# Patient Record
Sex: Female | Born: 1975 | Hispanic: Yes | Marital: Married | Smoking: Never smoker
Health system: Western US, Academic
[De-identification: ages and names within clinical notes are randomized; demographics above are authoritative.]

## PROBLEM LIST (undated history)

## (undated) HISTORY — PX: ABDOMINOPLASTY: SHX000001

## (undated) HISTORY — PX: PR HYSTEROSCOPY,W/ENDOMETRIAL ABLATION: 58563

---

## 2006-09-04 HISTORY — PX: RC TUBAL LIGATION: 998000004

## 2008-09-04 HISTORY — PX: AUGMENTATION, BREAST: SHX000076

## 2016-10-05 ENCOUNTER — Encounter (HOSPITAL_BASED_OUTPATIENT_CLINIC_OR_DEPARTMENT_OTHER): Payer: Self-pay | Admitting: Obstetrics & Gynecology

## 2016-10-05 ENCOUNTER — Ambulatory Visit (HOSPITAL_BASED_OUTPATIENT_CLINIC_OR_DEPARTMENT_OTHER): Payer: Commercial Managed Care - PPO | Admitting: Obstetrics & Gynecology

## 2016-10-05 VITALS — BP 112/64 | Ht 63.5 in | Wt 162.0 lb

## 2016-10-05 DIAGNOSIS — Z01419 Encounter for gynecological examination (general) (routine) without abnormal findings: Principal | ICD-10-CM

## 2016-10-05 DIAGNOSIS — K5909 Other constipation: Secondary | ICD-10-CM

## 2016-10-05 DIAGNOSIS — Z1231 Encounter for screening mammogram for malignant neoplasm of breast: Secondary | ICD-10-CM

## 2016-10-05 DIAGNOSIS — R102 Pelvic and perineal pain: Secondary | ICD-10-CM

## 2016-10-05 MED ORDER — DOCUSATE SODIUM 100 MG CAPSULE
100.00 mg | ORAL_CAPSULE | Freq: Two times a day (BID) | ORAL | 3 refills | Status: AC
Start: 2016-10-05 — End: 2017-09-30

## 2016-10-05 NOTE — Progress Notes (Signed)
Patient had endometrial ablation 2016.  Bleeding currently is 3 days.  Since December patient has noticed dull pain in back associated with bloating thru month.  Sharp pain first day of menses.  continues with monthly menses    Patient's last menstrual period was 09/22/2016 (exact date).  Normal regular menses.  Patient has always struggled with constipation.  Agreeable to colace.        Gynecology Clinic Note - Well Woman Exam    Date: 10/05/2016      ID:  Shelby Johnston is a 44yr Unknown here for a well-woman exam.      CC:  Routine well woman exam    HPI:  Last pap:  2016  History of abn pap: none  Last new sexaul partener:  married  Last std check:  na  Desires std check: no    Sexual History:  Sexually Active yes.   Current sexual problems: none.  Pain with intercourse: no.         Menses: regular  Birth control:  btl    ROS:   Review of Systems    Meds:    Current Outpatient Prescriptions:     Docusate (COLACE) 100 mg Capsule, Take 1 capsule by mouth 2 times daily., Disp: 120 capsule, Rfl: 3    All:  No Known Allergies    Medical:  No past medical history on file.    Surg:    Past Surgical History:   Procedure Laterality Date    AUGMENTATION, BREAST  2010    CESAREAN SECTION  1999    CESAREAN SECTION  2003    CESAREAN SECTION  2008    PR HYSTEROSCOPY,W/ENDOMETRIAL ABLATION      RC TUBAL LIGATION  2008         OB History:  Obstetric History    G3   P3   T<na>   P<na>   A<na>   L3     SAB<na>   TAB<na>   Ectopic<na>   Multiple<na>   Live Births<na>    Obstetric Comments   Last pap: 04/08/2015 Negative, HPV negative    Last mammo: n/a   STD History: denies   Abnormal pap: denies       Social:    Social History     Social History    Marital status: MARRIED     Spouse name: N/A    Number of children: N/A    Years of education: N/A     Occupational History    Not on file.     Social History Main Topics    Smoking status: Never Smoker    Smokeless tobacco: Never Used    Alcohol use No     Drug use: No    Sexual activity: Yes     Partners: Male     Birth control/ protection: Surgical     Other Topics Concern    Not on file     Social History Narrative    married       Vitals:      Vitals:    10/05/16 0946   BP: 112/64   Weight: 162 lb (73.5 kg)   Height: 5' 3.5" (1.613 m)       Physical Exam:  Physical Exam   Constitutional: She appears well-developed and well-nourished.   Abdominal: Soft. She exhibits no distension and no mass. There is no tenderness. There is no rebound and no guarding.   Genitourinary: No breast swelling, tenderness, discharge  or bleeding. No labial fusion. There is no rash, tenderness, lesion or injury on the right labia. There is no rash, tenderness, lesion or injury on the left labia. Uterus is not tender. Cervix exhibits motion tenderness. Cervix exhibits no discharge and no friability. Right adnexum displays no mass, no tenderness and no fullness. Left adnexum displays no mass, no tenderness and no fullness. No erythema, tenderness or bleeding in the vagina. No foreign body in the vagina. No signs of injury around the vagina. No vaginal discharge found.   Genitourinary Comments: Breast: bilateral implants  Uterus:  Retroverted. Unable to feel fundus.  No adnexal masses palpable.   Skin: Skin is warm and dry.   Psychiatric: She has a normal mood and affect. Her behavior is normal. Judgment and thought content normal.         Results Reviewed:      Assessment and Plan:  Shelby Johnston is a Unknown  [id number] presenting for well woman exam    1. Pelvic pain in female    - US Pelvis, Transvag + US Pelvis, Transabd, Non-Ob, Complete; Future    2. Other constipation    - Docusate (COLACE) 100 mg Capsule; Take 1 capsule by mouth 2 times daily.  Dispense: 120 capsule; Refill: 3    3. Well woman exam with routine gynecological exam  Mikal Plane, MD

## 2016-10-05 NOTE — Progress Notes (Signed)
GENERAL.CONSTITUTIONAL  Chills, Fatiuge, Fever, loss of appetite, denies all    HEENT/NECK  Change in vision, ear pain, nasal congestion, sore throat, denies all    ENDOCRINE  Excessive thirst, excessive urination, weight gain, weight loss, denies all    RESPIRATORY  Cough, SOB, wheezing, denies all    CARDIOVASCULAR  Chest pain, dyspnea on exertion leg edema, palpitations, denies all    GASTROINTESTINAL  Abdominal pain, blood in stool, change in bowel habits, diarrhea, nausea,vomiting, denies all    HEMATOLOGY/LYMPH  Easy bleeding, easy bruising, swollen glands, denies all    GENITOURINARY  Blood in urine, pain with urination, urniary frequency, denies all     MUSCULOSKELETAL  Muscle pain, joint pain, joint swelling, denies all    DERMATOLOGIC  Itching, new/changing skin lesion, rash, denies all    NEUROLOGIC  Dizziness, fainting, headache, denies all    PSYCHIATRIC  Anxiety, depression, sleep disturbances, denies all   Note: This is a self-reported review of symptoms

## 2016-10-17 ENCOUNTER — Ambulatory Visit
Admission: RE | Admit: 2016-10-17 | Discharge: 2016-10-22 | Disposition: A | Discharge status: Home / Self Care | Payer: 59 | Source: Ambulatory Visit | Attending: Obstetrics & Gynecology | Admitting: Obstetrics & Gynecology

## 2016-10-17 DIAGNOSIS — R102 Pelvic and perineal pain: Principal | ICD-10-CM

## 2016-10-17 DIAGNOSIS — D259 Leiomyoma of uterus, unspecified: Secondary | ICD-10-CM

## 2017-10-12 ENCOUNTER — Encounter (HOSPITAL_BASED_OUTPATIENT_CLINIC_OR_DEPARTMENT_OTHER): Payer: Self-pay | Admitting: Obstetrics & Gynecology

## 2017-10-12 ENCOUNTER — Ambulatory Visit (HOSPITAL_BASED_OUTPATIENT_CLINIC_OR_DEPARTMENT_OTHER): Payer: Self-pay | Admitting: Obstetrics & Gynecology

## 2017-10-15 ENCOUNTER — Ambulatory Visit (HOSPITAL_BASED_OUTPATIENT_CLINIC_OR_DEPARTMENT_OTHER): Payer: 59 | Admitting: Obstetrics & Gynecology

## 2017-10-15 ENCOUNTER — Encounter (HOSPITAL_BASED_OUTPATIENT_CLINIC_OR_DEPARTMENT_OTHER): Payer: Self-pay | Admitting: Obstetrics & Gynecology

## 2017-10-15 VITALS — BP 138/82 | Ht 64.0 in | Wt 167.0 lb

## 2017-10-15 DIAGNOSIS — N83209 Unspecified ovarian cyst, unspecified side: Secondary | ICD-10-CM

## 2017-10-15 DIAGNOSIS — Z01419 Encounter for gynecological examination (general) (routine) without abnormal findings: Principal | ICD-10-CM

## 2017-10-15 NOTE — Progress Notes (Signed)
patient is here for well woman.    Patient has history of small cyst on ovary.  Patient is having lower back pain. Dull.  Intermittent.    Menses:  Regular  Birth control:  btl    Mammo:  2019 normal.  Dense breasts.  Declines sono    Pap:  2019 current.    No current outpatient medications on file.    No Known Allergies    No past medical history on file.    Past Surgical History:   Procedure Laterality Date    AUGMENTATION, BREAST  2010    CESAREAN SECTION  1999    CESAREAN SECTION  2003    CESAREAN SECTION  2008    PR HYSTEROSCOPY,W/ENDOMETRIAL ABLATION      RC TUBAL LIGATION  2008       OB History   Gravida Para Term Preterm AB Living   3 3 <na> <na> <na> 3   SAB TAB Ectopic Multiple Live Births   <na> <na> <na> <na> <na>   Obstetric Comments   Last pap: 04/08/2015 Negative, HPV negative    Periods: every 28 days, regular, no problems.     Sexual activity with men, currently sexually active, married .      Last mammogram date 2019, before augmentation.     Abnormal pap smear none.     Date of Last Period 09/24/2017   Sexually Transmitted Diseases (STDs) none.  /   Birth control bilateral tubal ligation.     Menarche: Age at onset of menarche 28.        Social History     Socioeconomic History    Marital status: MARRIED     Spouse name: Not on file    Number of children: Not on file    Years of education: Not on file    Highest education level: Not on file   Occupational History    Not on file   Social Needs    Financial resource strain: Not on file    Food insecurity:     Worry: Not on file     Inability: Not on file    Transportation needs:     Medical: Not on file     Non-medical: Not on file   Tobacco Use    Smoking status: Never Smoker    Smokeless tobacco: Never Used   Substance and Sexual Activity    Alcohol use: No    Drug use: No    Sexual activity: Yes     Partners: Male     Birth control/protection: Surgical   Lifestyle    Physical activity:     Days per week: Not on file      Minutes per session: Not on file    Stress: Not on file   Relationships    Social connections:     Talks on phone: Not on file     Gets together: Not on file     Attends religious service: Not on file     Active member of club or organization: Not on file     Attends meetings of clubs or organizations: Not on file     Relationship status: Not on file    Intimate partner violence:     Fear of current or ex partner: Not on file     Emotionally abused: Not on file     Physically abused: Not on file     Forced sexual activity: Not on file  Other Topics Concern    Not on file   Social History Narrative    married       BP 138/82 (SITE: left arm, Orthostatic Position: sitting, Cuff Size: regular)   Ht 5\' 4"  (1.626 m)   Wt 167 lb (75.8 kg)   LMP 09/24/2017   BMI 28.67 kg/m   BP 138/82 (SITE: left arm, Orthostatic Position: sitting, Cuff Size: regular)   Ht 5\' 4"  (1.626 m)   Wt 167 lb (75.8 kg)   LMP 09/24/2017   BMI 28.67 kg/m   Physical Exam    General Appearance: WDWN, NAD  Psychiatric: Normal orientation, mood and affect  Breasts: Symmetrical, no masses and no discharge  Skin:No rashes, lesions, or ulcers  Lymph: Normal axillary  Abd:  NTND, no masses, non hernia, no HSM, no rebound/guarding  Pelvic examination:              Vulva/Perineum:  Normal development, no lesions, normal hair distribution, normal anus             Urethral Meatus:Normal location and size, no lesions or prolapse             Urethra: No masses, tenderness or scarring             Bladder:No masses, no tenderness             Vagina: Normal appearance, est effect, no discharge, lesions, cystocele or rectocele             Cervix: Normal appearance, no lesions, discharge or tenderness             Uterus: Normal size, contour, position, mobile, no tenderness and descensus             Adnexae:No masses, tenderness or nodularity  Ext:  No leg edema bilaterally, no calf tenderness bilaterally    Shelby Johnston was seen today for well woman  (gyn).    Diagnoses and all orders for this visit:    Well woman exam with routine gynecological exam    Cyst of ovary, unspecified laterality    Other orders  -     [facility] US PELVIS COMPLETE; Future

## 2017-10-15 NOTE — Nursing Note (Addendum)
GENERAL.CONSTITUTIONAL: Chills, Fatigue, Fever, loss of appetite    HEENT/NECK: Change in vision, ear pain, nasal congestion, sore throat    ENDOCRINE: Excessive thirst, excessive urination, weight gain, weight loss    RESPIRATORY: Cough, SOB, wheezing    CARDIOVASCULAR: Chest pain, dyspnea on exertion leg edema, palpitations    GASTROINTESTINAL: Abdominal pain, blood in stool, change in bowel habits, diarrhea, nausea,vomiting    HEMATOLOGY/LYMPH: Easy bleeding, easy bruising, swollen glands     GENITOURINARY: Blood in urine, pain with urination, urinary frequency    MUSCULOSKELETAL: Muscle pain, joint pain, joint swelling    DERMATOLOGIC: Itching, new/changing skin lesion, rash     NEUROLOGIC: Dizziness, fainting, headache    PSYCHIATRIC: Anxiety, depression, sleep disturbances    Note: This is a self-reported review of symptoms

## 2017-11-14 ENCOUNTER — Ambulatory Visit
Admission: RE | Admit: 2017-11-14 | Discharge: 2017-11-14 | Disposition: A | Discharge status: Home / Self Care | Payer: 59 | Source: Ambulatory Visit | Attending: Obstetrics & Gynecology | Admitting: Obstetrics & Gynecology

## 2017-11-14 DIAGNOSIS — N83209 Unspecified ovarian cyst, unspecified side: Secondary | ICD-10-CM

## 2018-07-15 ENCOUNTER — Encounter (HOSPITAL_BASED_OUTPATIENT_CLINIC_OR_DEPARTMENT_OTHER): Payer: Self-pay | Admitting: Obstetrics & Gynecology

## 2018-07-15 ENCOUNTER — Ambulatory Visit (HOSPITAL_BASED_OUTPATIENT_CLINIC_OR_DEPARTMENT_OTHER): Payer: 59 | Admitting: Obstetrics & Gynecology

## 2018-07-15 VITALS — BP 126/82 | Ht 64.0 in | Wt 162.8 lb

## 2018-07-15 DIAGNOSIS — M545 Low back pain: Secondary | ICD-10-CM

## 2018-07-15 DIAGNOSIS — R6882 Decreased libido: Principal | ICD-10-CM

## 2018-07-15 MED ORDER — BREMELANOTIDE 1.75 MG/0.3 ML SUBCUTANEOUS AUTO-INJECTOR
AUTO-INJECTOR | SUBCUTANEOUS | 11 refills | Status: DC
Start: 2018-07-15 — End: 2018-11-04

## 2018-07-15 NOTE — Progress Notes (Signed)
patient is here for dull lower back pain.  Happening midpoint between menses.      Birth control: btl    This pain has been happening for a couple of years.  Patient has low sex drive.     Working.  Same job.  Marriage is good.  Children:  20, 16, 11.    Intercourse:  Not pain.  Just not enjoyed.  Patient is not aroused.  Having sex about:  1 time this year.  Husband is mildly concerned.      10 year ago:  3-5 times a week.    Still having regular menses.  Last 4 days.      Patient has no history of painful sex.  Previously enjoyed sexual activity.     No hypertension.    Patient has read about vyleesi and would like to try.  Order thru company since not covered by insurance.    Patient will try motrin for back pain.      Current Outpatient Medications:     Acetaminophen (TYLENOL) 325 mg Tablet,   , Disp: , Rfl:     bremelanotide (VYLEESI) 1.75 mg/0.3 mL Auto-Injector, Use one injection - 45 minutes before desired response.  May use 1 injection per 24 hours.  Not to exceed 8 doses in 1 month., Disp: 8 syringe, Rfl: 11    [employer] IBUPROFEN JR 100 mg Tablet, 200 mg., Disp: , Rfl:     No Known Allergies    No past medical history on file.    Past Surgical History:   Procedure Laterality Date    ABDOMINOPLASTY      AUGMENTATION, BREAST  2010    CESAREAN SECTION  1999    CESAREAN SECTION  2003    CESAREAN SECTION  2008    PR HYSTEROSCOPY,W/ENDOMETRIAL ABLATION      RC TUBAL LIGATION  2008       OB History   Gravida Para Term Preterm AB Living   3 3 <na> <na> <na> 3   SAB TAB Ectopic Multiple Live Births   <na> <na> <na> <na> <na>   Obstetric Comments   Last pap: 04/08/2015 Negative, HPV negative    Periods: every 28 days, regular, no problems.     Sexual activity with men, currently sexually active, married .      Last mammogram date 2019, before augmentation.     Abnormal pap smear none.     Date of Last Period 07/12/2018   Sexually Transmitted Diseases (STDs) none.  /   Birth control bilateral tubal ligation.      Menarche: Age at onset of menarche 56.        BP 126/82 (SITE: left arm, Orthostatic Position: sitting, Cuff Size: regular)   Ht 5\' 4"  (1.626 m)   Wt 162 lb 12.8 oz (73.8 kg)   LMP 07/12/2018 (Exact Date)   BMI 27.94 kg/m     BP 126/82 (SITE: left arm, Orthostatic Position: sitting, Cuff Size: regular)   Ht 5\' 4"  (1.626 m)   Wt 162 lb 12.8 oz (73.8 kg)   LMP 07/12/2018 (Exact Date)   BMI 27.94 kg/m   Physical Exam    General Appearance: WDWN, NAD  Psychiatric: Normal orientation, mood and affect      Shelby Johnston was seen today for gyn exam.    Diagnoses and all orders for this visit:    Decreased libido without sexual dysfunction    Other orders  -     bremelanotide (VYLEESI)  1.75 mg/0.3 mL Auto-Injector; Use one injection - 45 minutes before desired response.  May use 1 injection per 24 hours.  Not to exceed 8 doses in 1 month.      Mikal Plane, MD

## 2018-07-15 NOTE — Nursing Note (Signed)
GENERAL.CONSTITUTIONAL: Chills, Fatigue, Fever, loss of appetite    HEENT/NECK: Change in vision, ear pain, nasal congestion, sore throat    ENDOCRINE: Excessive thirst, excessive urination, weight gain, weight loss    RESPIRATORY: Cough, SOB, wheezing    CARDIOVASCULAR: Chest pain, dyspnea on exertion leg edema, palpitations    GASTROINTESTINAL: Abdominal pain, blood in stool, change in bowel habits, diarrhea, nausea,vomiting    HEMATOLOGY/LYMPH: Easy bleeding, easy bruising, swollen glands     GENITOURINARY: Blood in urine, pain with urination, urinary frequency    MUSCULOSKELETAL: Muscle pain, joint pain, joint swelling    DERMATOLOGIC: Itching, new/changing skin lesion, rash     NEUROLOGIC: Dizziness, fainting, headache    PSYCHIATRIC: Anxiety, depression, sleep disturbances    Note: This is a self-reported review of symptoms

## 2018-11-04 ENCOUNTER — Ambulatory Visit (HOSPITAL_BASED_OUTPATIENT_CLINIC_OR_DEPARTMENT_OTHER): Payer: 59 | Admitting: Obstetrics & Gynecology

## 2018-11-04 ENCOUNTER — Encounter (HOSPITAL_BASED_OUTPATIENT_CLINIC_OR_DEPARTMENT_OTHER): Payer: Self-pay | Admitting: Obstetrics & Gynecology

## 2018-11-04 VITALS — BP 122/72 | HR 88 | Resp 14 | Ht 64.0 in | Wt 165.0 lb

## 2018-11-04 DIAGNOSIS — Z01419 Encounter for gynecological examination (general) (routine) without abnormal findings: Principal | ICD-10-CM

## 2018-11-04 DIAGNOSIS — Z9229 Personal history of other drug therapy: Secondary | ICD-10-CM

## 2018-11-04 NOTE — Progress Notes (Signed)
Mammo:  2020  Pap:  2016  Immunizations:  Flu, tetanus    Has pcp      Current Outpatient Medications:     Acetaminophen (TYLENOL) 325 mg Tablet,   , Disp: , Rfl:     CVS IBUPROFEN JR 100 mg Tablet, 200 mg., Disp: , Rfl:     No Known Allergies    No past medical history on file.    Past Surgical History:   Procedure Laterality Date    ABDOMINOPLASTY      AUGMENTATION, BREAST  2010    CESAREAN SECTION  1999    CESAREAN SECTION  2003    CESAREAN SECTION  2008    PR HYSTEROSCOPY,W/ENDOMETRIAL ABLATION      RC TUBAL LIGATION  2008       OB History   Gravida Para Term Preterm AB Living   3 3 <na> <na> <na> 3   SAB TAB Ectopic Multiple Live Births   <na> <na> <na> <na> <na>   Obstetric Comments   Last pap: 04/08/2015 Negative, HPV negative    Periods: every 28 days, regular, no problems.     Sexual activity with men, currently sexually active, married .      Last mammogram date 2020   Abnormal pap smear none.     Date of Last Period 10/29/18   Sexually Transmitted Diseases (STDs) none.     Birth control bilateral tubal ligation.     Menarche: Age at onset of menarche 40.        Social History     Socioeconomic History    Marital status: MARRIED     Spouse name: Not on file    Number of children: Not on file    Years of education: Not on file    Highest education level: Not on file   Occupational History    Not on file   Social Needs    Financial resource strain: Not on file    Food insecurity     Worry: Not on file     Inability: Not on file    Transportation needs     Medical: Not on file     Non-medical: Not on file   Tobacco Use    Smoking status: Never Smoker    Smokeless tobacco: Never Used   Substance and Sexual Activity    Alcohol use: No    Drug use: No    Sexual activity: Yes     Partners: Male     Birth control/protection: Surgical   Lifestyle    Physical activity     Days per week: Not on file     Minutes per session: Not on file    Stress: Not on file   Relationships    Social  connections     Talks on phone: Not on file     Gets together: Not on file     Attends religious service: Not on file     Active member of club or organization: Not on file     Attends meetings of clubs or organizations: Not on file     Relationship status: Not on file    Intimate partner violence     Fear of current or ex partner: Not on file     Emotionally abused: Not on file     Physically abused: Not on file     Forced sexual activity: Not on file   Other Topics Concern    Not on file  Social History Narrative    married       No family history on file.    BP 122/72 (SITE: right arm, Orthostatic Position: sitting, Cuff Size: regular)   Pulse 88   Resp 14   Ht 1.626 m (5\' 4" )   Wt 74.8 kg (165 lb)   LMP 10/29/2018 (Exact Date)   SpO2 98%   BMI 28.32 kg/m     BP 122/72 (SITE: right arm, Orthostatic Position: sitting, Cuff Size: regular)   Pulse 88   Resp 14   Ht 1.626 m (5\' 4" )   Wt 74.8 kg (165 lb)   LMP 10/29/2018 (Exact Date)   SpO2 98%   BMI 28.32 kg/m   Physical Exam    General Appearance: WDWN, NAD  Psychiatric: Normal orientation, mood and affect  Breasts: Symmetrical, no masses and no discharge, implants  Skin:No rashes, lesions, or ulcers  Lymph: Normal axillary  Abd:  NTND, no masses, non hernia, no HSM, no rebound/guarding  Pelvic examination:              Vulva/Perineum:  Normal development, no lesions, normal hair distribution, normal anus             Urethral Meatus:Normal location and size, no lesions or prolapse             Urethra: No masses, tenderness or scarring             Bladder:No masses, no tenderness             Vagina: Normal appearance, est effect, no discharge, lesions, cystocele or rectocele             Cervix: Normal appearance, no lesions, discharge or tenderness             Uterus: enlarged to 10 cm, contour, position, mobile, no tenderness and descensus             Adnexae:No masses, tenderness or nodularity  Ext:  No leg edema bilaterally, no calf tenderness  bilaterally    [place] was seen today for well woman (gyn).    Diagnoses and all orders for this visit:    Well woman exam with routine gynecological exam    Immunizations up to date      Mikal Plane, MD

## 2020-05-05 HISTORY — PX: PR REMOVAL INTACT BREAST IMPLANT: 19328

## 2021-02-02 LAB — COVID-19 (OUTSIDE ORGANIZATION): COVID-19 Results (Outside Organization): NOT DETECTED

## 2021-10-27 ENCOUNTER — Ambulatory Visit (HOSPITAL_BASED_OUTPATIENT_CLINIC_OR_DEPARTMENT_OTHER): Payer: 59 | Admitting: Family

## 2021-11-03 ENCOUNTER — Ambulatory Visit (HOSPITAL_BASED_OUTPATIENT_CLINIC_OR_DEPARTMENT_OTHER): Payer: 59 | Admitting: Family

## 2021-11-03 ENCOUNTER — Ambulatory Visit: Payer: 59

## 2021-11-03 ENCOUNTER — Encounter (HOSPITAL_BASED_OUTPATIENT_CLINIC_OR_DEPARTMENT_OTHER): Payer: Self-pay | Admitting: Family

## 2021-11-03 ENCOUNTER — Ambulatory Visit: Payer: 59 | Attending: Family | Admitting: Family

## 2021-11-03 VITALS — BP 124/76 | HR 60 | Temp 98.9°F | Resp 12 | Ht 64.0 in | Wt 170.0 lb

## 2021-11-03 DIAGNOSIS — Z1231 Encounter for screening mammogram for malignant neoplasm of breast: Secondary | ICD-10-CM

## 2021-11-03 DIAGNOSIS — Z1211 Encounter for screening for malignant neoplasm of colon: Secondary | ICD-10-CM

## 2021-11-03 DIAGNOSIS — Z113 Encounter for screening for infections with a predominantly sexual mode of transmission: Secondary | ICD-10-CM

## 2021-11-03 DIAGNOSIS — Z01419 Encounter for gynecological examination (general) (routine) without abnormal findings: Secondary | ICD-10-CM

## 2021-11-03 DIAGNOSIS — Z124 Encounter for screening for malignant neoplasm of cervix: Secondary | ICD-10-CM

## 2021-11-03 NOTE — Progress Notes (Signed)
Patient in for [place].  She is noticing she is having heavier cycles with increase pain after ablation.

## 2021-11-03 NOTE — Progress Notes (Signed)
HPI:  Shelby Johnston 46yr present to clinic today for well woman exam. LMP: 10/18/2021. Menses started at age 46, every 28 days, lasting 5 days.  Her periods are starting to becomne painful. She had an ablation 6 years ago. She takes Midol, which has helped her. Denies dysuria, abnormal uterine bleeding, abnormal vaginal discharge, vaginal pain and dyspareunia. Last PAP smear 2016. Denies h/o of STDs, fibroids or cysts. Sexually active with one female/female partner. She had BTL; no need for contraception.   Last mammogram normal, no h/o abnormal mammograms. Performs self breast exams monthly and denies breast masses, skin changes and nipple discharge. Patient exercises (HIT and yoga) and has healthy eating habits (for the past 3 months she has been on a low-carb, high-protein diet). She reports she has gained more than 10# in the past year. She feels safe at home and denies anxiety and depression. She has not complaints today and feels well.   Denis ETOH/tobacco/drugs  Works as a Warden/ranger  She has 3 kids: 57, 60 and 41    The patient was given the opportunity to ask questions throughout the visit.  These were answered to her stated satisfaction and she is agreeable to the plan of care.       No past medical history on file.  Past Surgical History:   Procedure Laterality Date    ABDOMINOPLASTY      AUGMENTATION, BREAST  2010    CESAREAN SECTION  1999    CESAREAN SECTION  2003    CESAREAN SECTION  2008    PR HYSTEROSCOPY,W/ENDOMETRIAL ABLATION      RC TUBAL LIGATION  2008     Current Outpatient Medications on File Prior to Visit   Medication Sig Dispense Refill    Acetaminophen (TYLENOL) 325 mg Tablet       CVS IBUPROFEN JR 100 mg Tablet 200 mg.       No current facility-administered medications on file prior to visit.     No Known Allergies      ROS  Constitutional: Negative.    HEENT: Negative  Respiratory: Negative.    Cardiovascular: Negative.    GI: Negative  Genitourinary: Negative  Skin: Negative.     M/S: Negative  Neurological: Negative.      BP 124/76 (SITE: left arm, Orthostatic Position: sitting, Cuff Size: regular)   Pulse 60   Temp 37.2 C (98.9 F) (Tympanic)   Resp 12   Ht 1.626 m (5\' 4" )   Wt 77.1 kg (170 lb)   BMI 29.18 kg/m?      P.E.  Gen- no acute distress, alert and oriented x 3  HEENT: NCAT, no thyromegaly, no LAD  Heart- RRR, no m/r/g  Lungs- Symmetric, CTAB  Breasts: No masses, skin changes, nipple discharge or tenderness noted  Abdomen- soft, NT, Non distended. No HSM, no masses, bowel sounds present  GU: externalia genitalia , urethra, perineum and anus with no lesions or masses.  Speculum exam revealed moist, pink, vaginal rugae, no masses or lesions and no significant bladder, uterine or rectal prolapse.  Cervix non-friable without any masses, lesions or irregularities.  No blood visualized at os.  No abonormal or malodorous discharge. PAP collected today.  Bimanual exam no masses appreciated, no adnexal masses or tenderness on palpation.   Ext: No cyanosis, no edema    A&P    ICD-10-CM    1. Well woman exam  Z01.419       2. Cervical cancer screening  Z12.4 Pap Smear w/ High Risk HPV [facility])     Pap Smear w/ High Risk HPV ([place])      3. Encounter for screening mammogram for malignant neoplasm of breast  Z12.31 [facility] MAMMOGRAM BREAST SCREENING W/TOMO      4. Colon cancer screening  Z12.11 COLOGUARD     COLOGUARD      5. Screen for STD (sexually transmitted disease)  Z11.3 [facility] Chlamydia/Gonorrhea DNA Probe     HIV Ag/Ab Combo Screen     Hepatitis C Ab Screen     Hepatitis B Surface Antigen     [facility] Syphilis Test Serum ([facility])     [employer] Chlamydia/Gonorrhea DNA Probe        - Cologuard future dated to patient's upcoming birthday  - Mammogram ordered.   - PAP collected today, will call with results.    - STD screening labs ordered.    - patient counseled on breast and ovarian awareness and symptoms which warrant further evaluation.  - Patient counseled to notify me if any abnormal vaginal bleeding  or spotting  - Patient counseled on health eating habits and exercise 54min/day, 5x/week to maintain health and decrease risk of breast cancer  - follow up in one year or sooner if needed    Progress notes authenticated by Ciro Backer, NP 11/03/2021 1:12 PM

## 2021-11-03 NOTE — Patient Instructions (Signed)
STD screening and PAP ordered, will call with results.   Mammogram ordered, will call with results.     Nice to meet you!

## 2021-11-04 LAB — HEPATITIS C AB SCREEN: Hepatitis C Ab Screen: NONREACTIVE

## 2021-11-04 LAB — MMC SYPHILIS TEST SERUM (RPR): RPR Screen: NONREACTIVE

## 2021-11-04 LAB — HIV AG/AB COMBO SCREEN: HIV Ag/Ab Combo Screen: NONREACTIVE

## 2021-11-04 LAB — HEPATITIS B SURFACE ANTIGEN: Hepatitis B Surface Antigen: NONREACTIVE

## 2021-11-04 LAB — MMC CHLAMYDIA/GONORRHEA DNA PROBE
Chlamydia Trachomatis Dna Probe: NEGATIVE
Neisseria Gonorrhea Dna Probe: NEGATIVE

## 2021-11-13 LAB — PAP SMEAR W/ HIGH RISK HPV (MMC)
.: 0
HPV Aptima_Ext: NEGATIVE

## 2021-12-11 LAB — COVID-19 (OUTSIDE ORGANIZATION): COVID-19 Results (Outside Organization): NOT DETECTED

## 2021-12-16 ENCOUNTER — Ambulatory Visit: Payer: 59

## 2022-01-06 ENCOUNTER — Ambulatory Visit
Admission: RE | Admit: 2022-01-06 | Discharge: 2022-01-06 | Disposition: A | Discharge status: Home / Self Care | Payer: 59 | Source: Ambulatory Visit | Attending: Family | Admitting: Family

## 2022-01-06 DIAGNOSIS — Z1231 Encounter for screening mammogram for malignant neoplasm of breast: Secondary | ICD-10-CM | POA: Insufficient documentation

## 2022-03-16 IMAGING — CT CT ABD/PEL WO CONT
2 of 4 series · 11 of 46 positions shown, 12 images · non-contrast
Comparison: CT abdomen and pelvis, 06/07/15

HISTORY: Mid abdominal pain with nausea, vomiting and urinary tract infection.
TECHNIQUE: Scans of abdomen and pelvis are performed at 5 mm increments following administration of oral contrast only with coronal and sagittal reconstructions.

Dose reduction technique used: Automated exposure control and adjustment of the mA and/or kV according to patient size. CT count in previous 12-months: 0
Total radiation dose to patient is CTDIvol 14.70 mGy and DLP 793.00 mGy-cm.

[Series 2: soft tissue · axial · 0.51mm/px · z∈[+1167,+1582]mm · 8 of 101 slices shown, 9 images]
[im 9/101  soft-tissue]
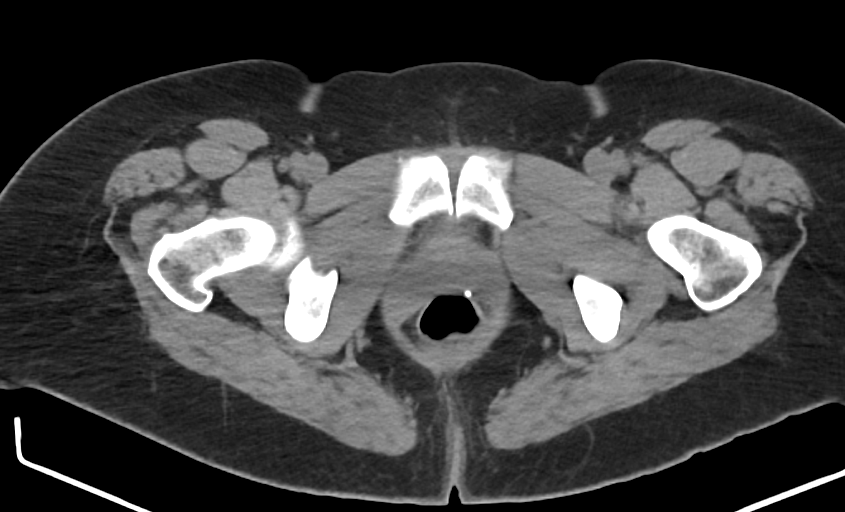
[im 9/101  bone]
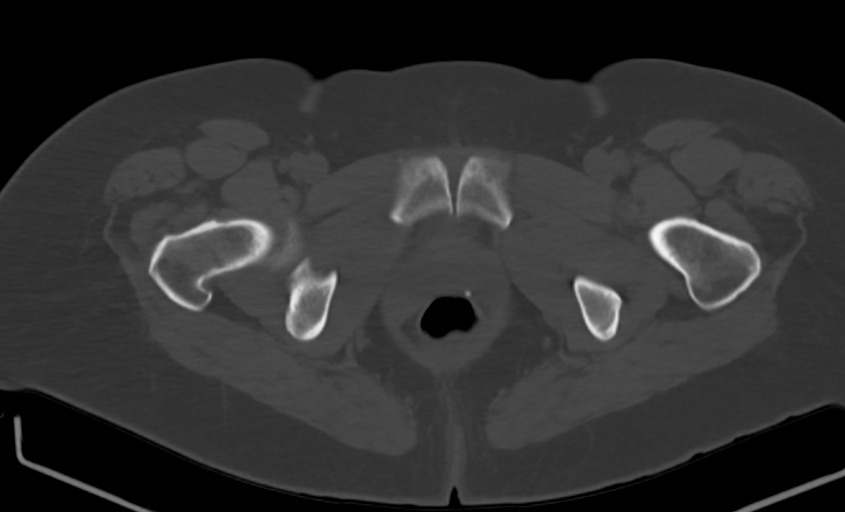
[im 22/101  soft-tissue]
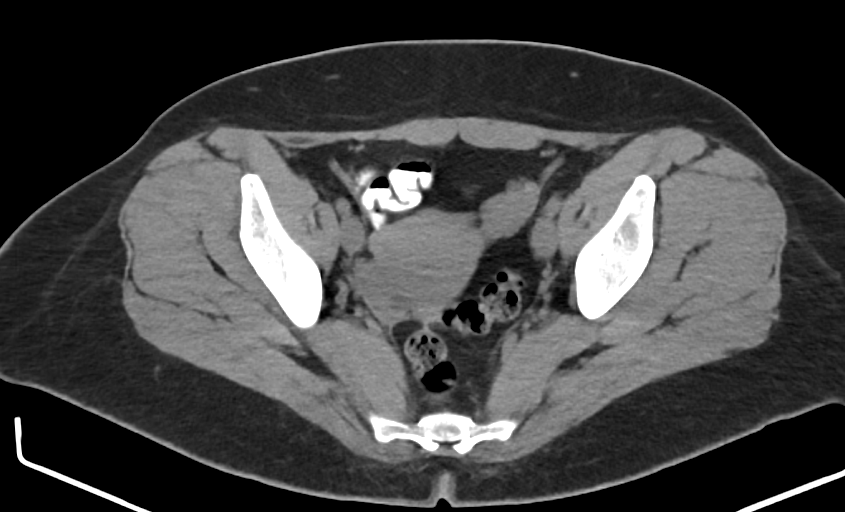
[im 31/101  soft-tissue]
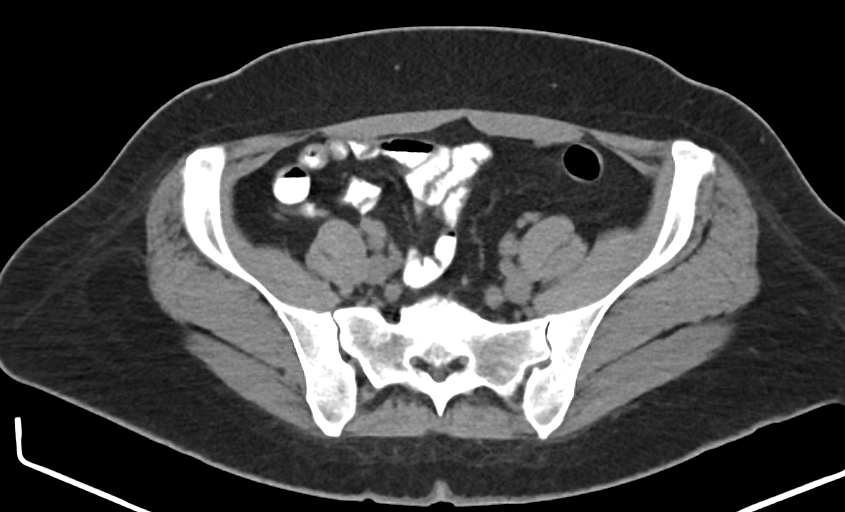
[im 44/101  soft-tissue]
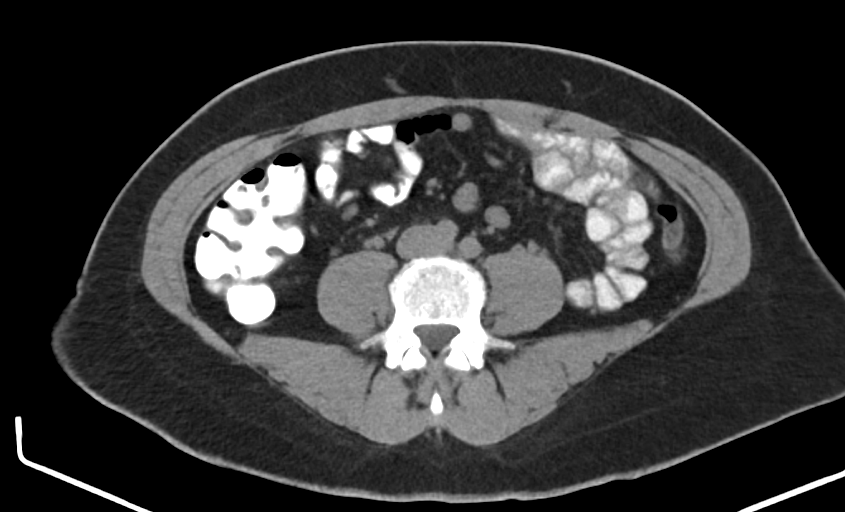
[im 57/101  soft-tissue]
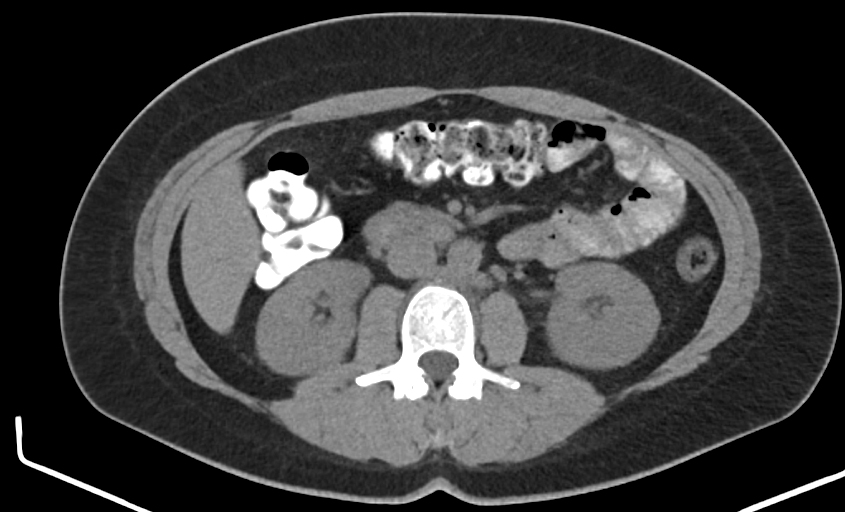
[im 70/101  soft-tissue]
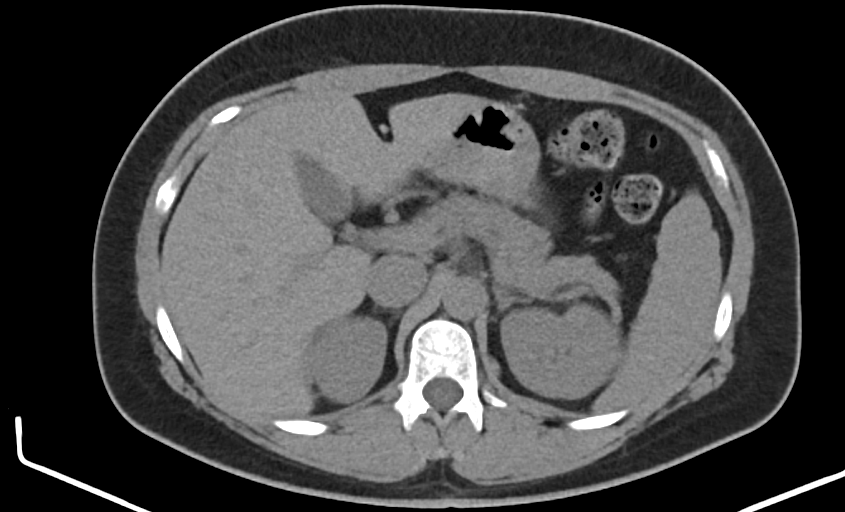
[im 79/101  soft-tissue]
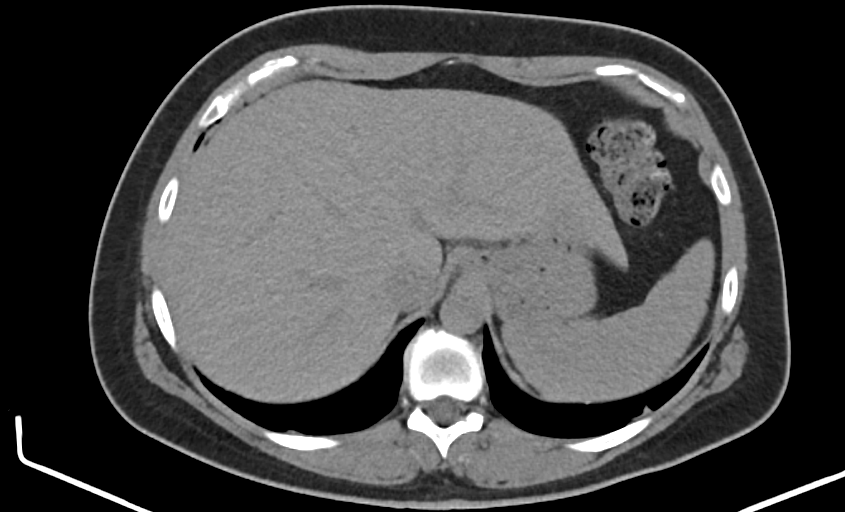
[im 92/101  soft-tissue]
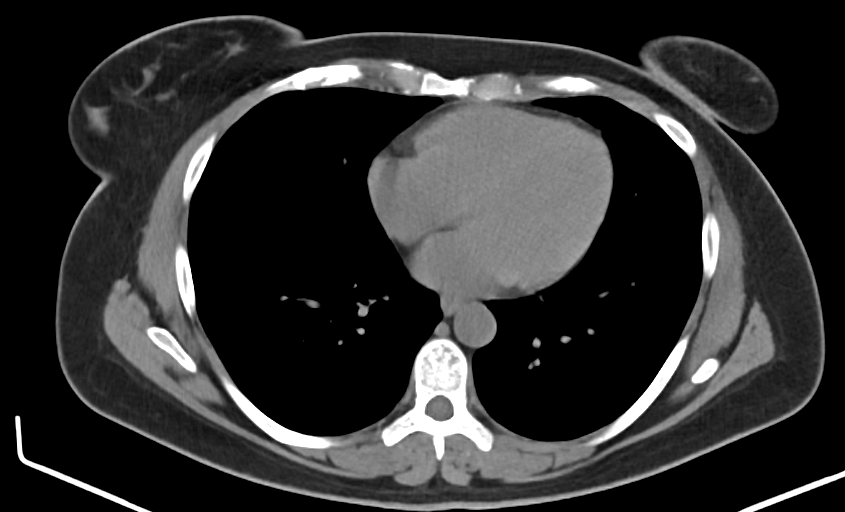

[Series 4: coronal · coronal · 0.85mm/px · 3 of 52 slices shown]
[im 18/52  soft-tissue]
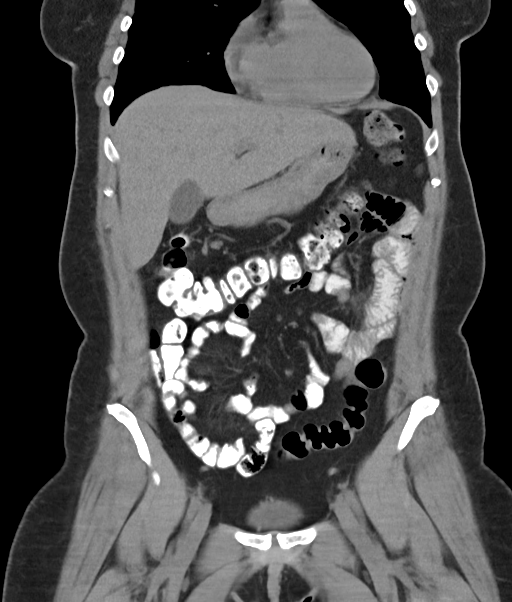
[im 23/52  soft-tissue]
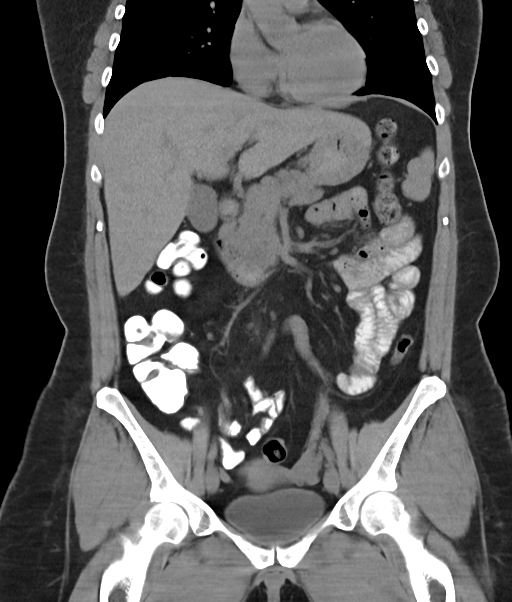
[im 29/52  soft-tissue]
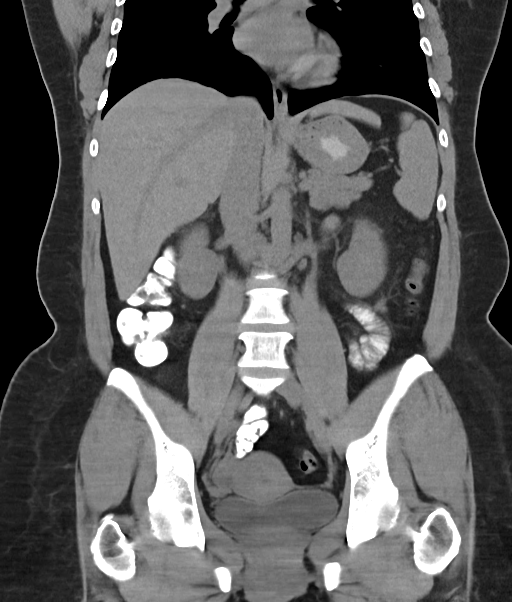

[11 of 46 positions shown; findings below may reference images not displayed]

FINDINGS: CT ABDOMEN: Lung bases are clear except for mild bibasal pulmonary scarring which is more pronounced than on previous exam. No infiltrate, nodule or pleural finding is present. Heart size is normal and no hiatal hernia is present. The liver, spleen, gallbladder and biliary system, pancreas, adrenal glands and kidneys are normal. The periaortic and paracaval spaces are normal and no intraperitoneal findings are seen in the upper or midabdomen.

CT PELVIS: The right colon including terminal ileum and appendix are normal and the remaining colon is normal with no evidence of diverticulitis or colitis. No bowel distention or obstruction is present. Uterus and adnexa are normal. Distal ureters and bladder are normal. No urinary tract retention is seen. No inguinal findings are present. Coronal and sagittal reconstructions show no additional findings. No bone abnormality is present.
IMPRESSION: Normal CT scan of abdomen and pelvis for acute or significant findings. No change from prior exam has occurred.
